# Patient Record
Sex: Female | Born: 1955 | Race: White | Hispanic: No | Marital: Married | Smoking: Former smoker
Health system: Southern US, Community
[De-identification: ages and names within clinical notes are randomized; demographics above are authoritative.]

## PROBLEM LIST (undated history)

## (undated) DIAGNOSIS — R112 Nausea with vomiting, unspecified: Secondary | ICD-10-CM

## (undated) DIAGNOSIS — K589 Irritable bowel syndrome without diarrhea: Secondary | ICD-10-CM

## (undated) DIAGNOSIS — Z9889 Other specified postprocedural states: Secondary | ICD-10-CM

## (undated) HISTORY — PX: TUBAL LIGATION: SHX77

## (undated) HISTORY — DX: Irritable bowel syndrome, unspecified: K58.9

## (undated) HISTORY — PX: CARPAL TUNNEL RELEASE: SHX101

## (undated) HISTORY — PX: APPENDECTOMY: SHX54

---

## 2001-05-08 ENCOUNTER — Ambulatory Visit (HOSPITAL_BASED_OUTPATIENT_CLINIC_OR_DEPARTMENT_OTHER): Admission: RE | Admit: 2001-05-08 | Discharge: 2001-05-08 | Payer: Self-pay | Admitting: Orthopedic Surgery

## 2010-02-13 ENCOUNTER — Ambulatory Visit: Payer: Self-pay | Admitting: Orthopedic Surgery

## 2010-02-13 DIAGNOSIS — M25569 Pain in unspecified knee: Secondary | ICD-10-CM | POA: Insufficient documentation

## 2010-12-26 NOTE — Assessment & Plan Note (Signed)
Summary: BI KNEE PAIN NEEDS XR/[facility]/BSF   Visit Type:  new patient  CC:  bilateral knee pain.Marland Kitchen  History of Present Illness: 55-year-old female presents with bilateral knee pain RIGHT greater than LEFT.  She's had pain in the back of her RIGHT knee for 6 weeks.  Her pain is medial it is severe it is 8/10 on a scale.  Her pain is noticed when she is sitting in summary by ibuprofen.  She does not note any swelling.  When she gets up from a seated position her pain increases.  Sitting for long periods with the knee flexed causes knee pain.  She also had some RIGHT leg pain behind the knee which seems to be resolving this appeared to be radiating from the hip.  Xrays today.  Allergies (verified): No Known Drug Allergies  Past History:  Past Medical History: none  Past Surgical History: none  Review of Systems Constitutional:  Denies weight loss, weight gain, fever, chills, and fatigue. Cardiovascular:  Denies chest pain, palpitations, fainting, and murmurs. Respiratory:  Denies short of breath, wheezing, couch, tightness, pain on inspiration, and snoring . Gastrointestinal:  Denies heartburn, nausea, vomiting, diarrhea, constipation, and blood in your stools. Genitourinary:  Denies frequency, urgency, difficulty urinating, painful urination, flank pain, and bleeding in urine. Neurologic:  Denies numbness, tingling, unsteady gait, dizziness, tremors, and seizure. Musculoskeletal:  Denies joint pain, swelling, instability, stiffness, redness, heat, and muscle pain. Endocrine:  Denies excessive thirst, exessive urination, and heat or cold intolerance. Psychiatric:  Denies nervousness, depression, anxiety, and hallucinations. Skin:  Denies changes in the skin, poor healing, rash, itching, and redness. HEENT:  Denies blurred or double vision, eye pain, redness, and watering. Immunology:  Denies seasonal allergies, sinus problems, and allergic to bee stings. Hemoatologic:  Denies easy bleeding  and brusing.  Physical Exam  Additional Exam:  GEN: well developed, well nourished, normal grooming and hygiene, no deformity and Body habitus medium  CDV: pulses are normal, no edema, no erythema. no tenderness  Lymph: normal lymph nodes   Skin: no rashes, skin lesions or open sores   NEURO: normal coordination, reflexes, sensation.   Psyche: awake, alert and oriented. Mood normal   Gait: normal  Bilateral knee examination Inspection reveals no joint swelling.  No tenderness in the joint lines.  Patellar tendon quadriceps tendon nontender Sheehan full range of motion in each knee Motor exam grade 5 strength both legs negative meniscal signs in both knees Both knees are stable     Impression & Recommendations:  Problem # 1:  PATELLO-FEMORAL SYNDROME (ICD-719.46) Assessment New  anterior knee pain syndrome with resolving sciatica.  Recommend home exercise program for 6 weeks.  X-rays were done in the office both knees 3 views Joint space narrowing none, bony abnormalities none, soft tissue swelling none  Impression normal RIGHT and LEFT knee  Orders: New Patient Level III (24401) Knee x-ray,  3 views (02725)  Patient Instructions: 1)  PROBLEM # 1 ANTERIOR KNEE PAIN  2)  PROBLEM  # 2 PROBABLE SCIATICA  3)  SELF DIRECTED EXERCISE PROGRAM

## 2010-12-26 NOTE — Letter (Signed)
Summary: History form  History form   Imported By: Jacklynn Ganong 02/24/2010 08:24:22  _____________________________________________________________________  External Attachment:    Type:   Image     Comment:   External Document

## 2011-04-13 NOTE — Op Note (Signed)
[place]  Patient:    Mariah Mack, Mariah Mack                     MRN: [id number] Proc. Date: 05/08/01 Attending:  Nicki Reaper, M.D.                           Operative Report  PREOPERATIVE DIAGNOSIS: Carpal tunnel syndrome, right hand.  POSTOPERATIVE DIAGNOSIS: Carpal tunnel syndrome, right hand.  OPERATION/PROCEDURE: Decompression of right median nerve.  SURGEON: Nicki Reaper, M.D.  ANESTHESIA: Forearm-based IV regional.  ANESTHESIOLOGIST: Janetta Hora. Gelene Mink, M.D.  INDICATIONS FOR PROCEDURE: The patient is a 55 year old female, with a history of carpal tunnel syndrome, EMG nerve conductions positive, which has not responded to conservative treatment.  DESCRIPTION OF PROCEDURE: The patient was brought to the operating room, where a forearm-based IV regional anesthetic was carried out without difficulty. She was prepped and draped using Betadine scrubbing solution with the right arm free.  A longitudinal incision was made in the palm and carried down through subcutaneous tissue.  Bleeders were electrocauterized.  Palmar fascia was split and the superficial palmar arch identified.  The flexor tendon to the ring and little finger was identified and through the ulnar side a median nerve carpal retinaculum was incised with sharp dissection.  A right angle and Sewall retractor were placed between skin and forearm fascia and the fascia was released for approximately 3 cm proximal to the wrist crease under direct vision.  The canal was explored and no further lesions were identified.  The wound was irrigated.  The skin was closed with interrupted 5-0 nylon sutures. A sterile compressive dressing and splint were applied.  The patient tolerated the procedure well and was taken to the recovery room for observation in satisfactory condition.  She was discharged home to return to the [facility] in one week, on Vicodin and Keflex. DD:   05/08/01 TD:  05/08/01 Job: [id number]

## 2011-07-04 ENCOUNTER — Telehealth: Payer: Self-pay | Admitting: General Practice

## 2011-07-04 NOTE — Telephone Encounter (Signed)
Gastroenterology Pre-Procedure Form  Request Date: 07/04/2011,  Requesting Physician: DR Ouida Sills     PATIENT INFORMATION:  Mariah Mack is a 55 y.o., female (DOB=1956/06/02).  PROCEDURE: Procedure(s) requested: colonoscopy Procedure Reason: screening for colon cancer  PATIENT REVIEW QUESTIONS: The patient reports the following:   1. Diabetes Melitis: no 2. Joint replacements in the past 12 months: no 3. Major health problems in the past 3 months: PT HAS CONSTIPATION , SHE WILL NEED A OFFICE VISIT FIRST. 4. Has an artificial valve or MVP:no 5. Has been advised in past to take antibiotics in advance of a procedure like teeth cleaning: no}    MEDICATIONS & ALLERGIES:    Patient reports the following regarding taking any blood thinners:   Plavix? no Aspirin?no Coumadin?  no  Patient confirms/reports the following medications:  No current outpatient prescriptions on file.    Patient confirms/reports the following allergies:  No Known Allergies  Patient is appropriate to schedule for requested procedure(s): yes   This Gastroenterology Pre-Precedure Form is being routed to the following provider(s) for review: R. Roetta Sessions, MD

## 2011-07-11 ENCOUNTER — Ambulatory Visit (INDEPENDENT_AMBULATORY_CARE_PROVIDER_SITE_OTHER): Payer: 59 | Admitting: Urgent Care

## 2011-07-11 ENCOUNTER — Encounter: Payer: Self-pay | Admitting: Urgent Care

## 2011-07-11 DIAGNOSIS — Z1211 Encounter for screening for malignant neoplasm of colon: Secondary | ICD-10-CM | POA: Insufficient documentation

## 2011-07-11 DIAGNOSIS — K589 Irritable bowel syndrome without diarrhea: Secondary | ICD-10-CM | POA: Insufficient documentation

## 2011-07-11 DIAGNOSIS — Z1210 Encounter for screening for malignant neoplasm of intestinal tract, unspecified: Secondary | ICD-10-CM

## 2011-07-11 NOTE — Patient Instructions (Signed)
Fiber supplement of choice (Fiberchoice or metamucil) Can use miralax 17 grams daily as needed for constipation  Irritable Bowel Syndrome You have a problem with your large bowel. This is called irritable bowel syndrome or spastic colon. It can cause cramping in the lower belly. You may also have more rumbling, gurgling, and bloating. Diarrhea and mucus in the stool are common. Constipation with hard small stools is also common. The pain will often recede after a bowel movement. This problem can come and go for months or years. The cause is unknown. A poor diet and emotional stress can add to the problems. There is no test to prove you have irritable bowel. Some tests and x-rays may be needed to be sure you do not have a more serious problem. Treatment of irritable bowel includes:  Diet - Increase fiber and bulk (whole grains, bran, vegetables, fruit) and reduce fats (fried foods, dairy products, meats).   Exercise regularly. Use walking or other exercises to reduce your stress.   Try a psyllium product (Metamucil, Fiberall). One tablespoon in water two times daily may be helpful.   For diarrhea from irritable bowel, avoid food containing fructose and lactose.   Reduce gas and bloating by avoiding milk products, beans, onions, carrots, prunes, apricots, bananas, raisins, pretzels, and bagels.   See your caregiver for follow-up as recommended.  SEEK IMMEDIATE MEDICAL CARE IF YOU HAVE:  Increasing abdominal pain.   Lasting diarrhea.   Severe constipation.   Blood in your stools.   Problems passing your water.   A fever.  Medications to treat spasms, diarrhea, anxiety, or depression may also be beneficial. Your caregiver can help you with this. MAKE SURE YOU:   Understand these instructions.   Will watch your condition.   Will get help right away if you are not doing well or get worse.  Document Released: 11/12/2005 Document Re-Released: 10/25/2008 [facility] Patient Information  2011 [place], [place].

## 2011-07-11 NOTE — Progress Notes (Signed)
Referring Provider:  Dr. Ouida Sills Primary Care Physician:  Carylon Perches, MD Primary Gastroenterologist:  Dr. Jena Gauss  Chief Complaint  Patient presents with  . Colonoscopy  . Constipation    HPI:  Mariah Mack is a 55 y.o. female here as a referral from Dr. Ouida Sills for colonoscopy.  Hx IBS that alternates w/ constipation & diarrhea life-long.  Taking metamucil & drinking prune juice.  Denies rectal bleeding or melena..  Denies abdominal pain except LUQ pain if she eats corn products.  Rare heartburn or indigestion couple times per mo, responds to TUMs.  Denies dysphagia or odynophagia.  Gained 15# over past 4 yrs, trying to lose wt on wt watchers.    Past Medical History  Diagnosis Date  . IBS (irritable bowel syndrome)    Past Surgical History  Procedure Date  . Appendectomy    No current outpatient prescriptions on file.   Allergies as of 07/11/2011  . (No Known Allergies)   Family History  Problem Relation Age of Onset  . Stroke Father   . Coronary artery disease Mother    History   Social History  . Marital Status: Married    Spouse Name: N/A    Number of Children: 3  . 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 Smoking status: Former Smoker -- 0.5 packs/day for 8 years    Types: Cigarettes  . Smokeless tobacco: Not on file   Comment: quit 27 years ago  . Alcohol Use: No  . Drug Use: No  . Sexually Active: Yes    Birth Control/ Protection: Post-menopausal   Other Topics Concern  . Not on file   Social History Narrative  . No narrative on file    Review of Systems: Gen: Denies any fever, chills, sweats, anorexia, fatigue, weakness, malaise, weight loss, and sleep disorder CV: Denies chest pain, angina, palpitations, syncope, orthopnea, PND, peripheral edema, and claudication. Resp: Denies dyspnea at rest, dyspnea with exercise, cough, sputum, wheezing, coughing up blood, and pleurisy. GI: Denies vomiting  blood, jaundice, and fecal incontinence.    GU : Denies urinary burning, blood in urine, urinary frequency, urinary hesitancy, nocturnal urination, and urinary incontinence. MS: Denies joint pain, limitation of movement, and swelling, stiffness, low back pain, extremity pain. Denies muscle weakness, cramps, atrophy.  Derm: Denies rash, itching, dry skin, hives, moles, warts, or unhealing ulcers.  Psych: Denies depression, anxiety, memory loss, suicidal ideation, hallucinations, paranoia, and confusion. Heme: Denies bruising, bleeding, and enlarged lymph nodes.  Physical Exam: BP 112/73  Pulse 63  Temp(Src) 97.8 F (36.6 C) (Temporal)  Ht 5\' 6"  (1.676 m)  Wt 176 lb 9.6 oz (80.105 kg)  BMI 28.50 kg/m2 General:   Alert,  Well-developed, well-nourished, pleasant and cooperative in NAD Head:  Normocephalic and atraumatic. Eyes:  Sclera clear, no icterus.   Conjunctiva pink. Ears:  Normal auditory acuity. Nose:  No deformity, discharge,  or lesions. Mouth:  No deformity or lesions, dentition normal. Neck:  Supple; no masses or thyromegaly. Lungs:  Clear throughout to auscultation.   No wheezes, crackles, or rhonchi. No acute distress. Heart:  Regular rate and rhythm; no murmurs, clicks, rubs,  or gallops. Abdomen:  Soft, nontender and nondistended. No masses, hepatosplenomegaly or hernias noted. Normal bowel sounds, without guarding, and without rebound.   Rectal:  Deferred until time of colonoscopy.   Msk:  Symmetrical without gross deformities. Normal posture. Pulses:  Normal pulses noted. Extremities:  Without clubbing or edema. Neurologic:  Alert and  oriented x4;  grossly normal neurologically. Skin:  Intact without significant lesions or rashes. Cervical Nodes:  No significant cervical adenopathy. Psych:  Alert and cooperative. Normal mood and affect.

## 2011-07-11 NOTE — Assessment & Plan Note (Addendum)
Due for screening colonoscopy.  No alarm features.  IBS-mixed for yrs.  I have discussed risks & benefits which include, but are not limited to, bleeding, infection, perforation & drug reaction.  The patient agrees with this plan & written consent will be obtained.

## 2011-07-11 NOTE — Assessment & Plan Note (Addendum)
Mariah Mack is a 55 y.o. 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   Fiber supplement of choice (Fiberchoice or metamucil) Can use miralax 17 grams daily as needed for constipation IBS literature

## 2011-07-16 MED ORDER — SODIUM CHLORIDE 0.45 % IV SOLN
Freq: Once | INTRAVENOUS | Status: AC
  Administered 2011-07-17: 20 mL via INTRAVENOUS

## 2011-07-17 ENCOUNTER — Ambulatory Visit (HOSPITAL_COMMUNITY)
Admission: RE | Admit: 2011-07-17 | Discharge: 2011-07-17 | Disposition: A | Discharge status: Home / Self Care | Payer: 59 | Source: Ambulatory Visit | Attending: Internal Medicine | Admitting: Internal Medicine

## 2011-07-17 ENCOUNTER — Encounter (HOSPITAL_COMMUNITY)
Admission: RE | Disposition: A | Discharge status: Home / Self Care | Payer: Self-pay | Source: Ambulatory Visit | Attending: Internal Medicine

## 2011-07-17 ENCOUNTER — Encounter (HOSPITAL_COMMUNITY): Payer: Self-pay

## 2011-07-17 DIAGNOSIS — Z1211 Encounter for screening for malignant neoplasm of colon: Secondary | ICD-10-CM

## 2011-07-17 HISTORY — DX: Other specified postprocedural states: Z98.890

## 2011-07-17 HISTORY — DX: Nausea with vomiting, unspecified: R11.2

## 2011-07-17 HISTORY — PX: COLONOSCOPY: SHX5424

## 2011-07-17 SURGERY — COLONOSCOPY
Anesthesia: Moderate Sedation

## 2011-07-17 MED ORDER — MIDAZOLAM HCL 5 MG/5ML IJ SOLN
INTRAMUSCULAR | Status: DC | PRN
  Administered 2011-07-17: 1 mg via INTRAVENOUS
  Administered 2011-07-17 (×2): 2 mg via INTRAVENOUS

## 2011-07-17 MED ORDER — ONDANSETRON HCL 4 MG/2ML IJ SOLN
INTRAMUSCULAR | Status: DC | PRN
  Administered 2011-07-17: 4 mg via INTRAVENOUS

## 2011-07-17 MED ORDER — MIDAZOLAM HCL 5 MG/5ML IJ SOLN
INTRAMUSCULAR | Status: AC
  Filled 2011-07-17: qty 10

## 2011-07-17 MED ORDER — ONDANSETRON HCL 4 MG/2ML IJ SOLN
INTRAMUSCULAR | Status: AC
  Filled 2011-07-17: qty 2

## 2011-07-17 MED ORDER — MEPERIDINE HCL 100 MG/ML IJ SOLN
INTRAMUSCULAR | Status: AC
  Filled 2011-07-17: qty 2

## 2011-07-17 MED ORDER — MEPERIDINE HCL 100 MG/ML IJ SOLN
INTRAMUSCULAR | Status: DC | PRN
  Administered 2011-07-17: 25 mg via INTRAVENOUS
  Administered 2011-07-17: 50 mg via INTRAVENOUS

## 2011-07-17 NOTE — H&P (Signed)
  Primary Care Physician:  Carylon Perches, MD Primary Gastroenterologist:  Dr.   Pre-Procedure History & Physical: HPI:  Mariah Mack is a 55 y.o. female here for   Past Medical History  Diagnosis Date  . IBS (irritable bowel syndrome)   . PONV (postoperative nausea and vomiting)     Past Surgical History  Procedure Date  . Appendectomy   . Tubal ligation     Prior to Admission medications   Not on File    Allergies as of 07/11/2011  . (No Known Allergies)    Family History  Problem Relation Age of Onset  . Stroke Father   . Coronary artery disease Mother   . Anesthesia problems Neg Hx   . Hypotension Neg Hx   . Malignant hyperthermia Neg Hx   . Pseudochol deficiency Neg Hx     History   Social History  . Marital Status: Married    Spouse Name: N/A    Number of Children: 3  . 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 Smoking status: Former Smoker -- 0.5 packs/day for 8 years    Types: Cigarettes    Quit date: 07/16/1985  . Smokeless tobacco: Not on file   Comment: quit 27 years ago  . Alcohol Use: No  . Drug Use: No  . Sexually Active: Yes    Birth Control/ Protection: Post-menopausal   Other Topics Concern  . Not on file   Social History Narrative  . No narrative on file    Review of Systems: See HPI, otherwise negative ROS  Physical Exam: BP 127/78  Pulse 63  Temp(Src) 98.1 F (36.7 C) (Oral)  Resp 17  Ht 5\' 6"  (1.676 m)  Wt 170 lb 8 oz (77.338 kg)  BMI 27.52 kg/m2  SpO2 96% General:   Alert,  Well-developed, well-nourished, pleasant and cooperative in NAD Head:  Normocephalic and atraumatic. Eyes:  Sclera clear, no icterus.   Conjunctiva pink. Ears:  Normal auditory acuity. Nose:  No deformity, discharge,  or lesions. Mouth:  No deformity or lesions, dentition normal. Neck:  Supple; no masses or thyromegaly. Lungs:  Clear throughout to auscultation.   No wheezes, crackles, or  rhonchi. No acute distress. Heart:  Regular rate and rhythm; no murmurs, clicks, rubs,  or gallops. Abdomen:  Soft, nontender and nondistended. No masses, hepatosplenomegaly or hernias noted. Normal bowel sounds, without guarding, and without rebound.   Msk:  Symmetrical without gross deformities. Normal posture. Pulses:  Normal pulses noted. Extremities:  Without clubbing or edema. Neurologic:  Alert and  oriented x4;  grossly normal neurologically. Skin:  Intact without significant lesions or rashes. Cervical Nodes:  No significant cervical adenopathy. Psych:  Alert and cooperative. Normal mood and affect.  Impression/Plan:

## 2011-07-17 NOTE — H&P (Signed)
  Primary Care Physician:  Carylon Perches, MD Primary Gastroenterologist:  Dr. Jena Gauss  Pre-Procedure History & Physical: HPI:  Mariah Mack is a 55 y.o. female is here for a screening colonoscopy. First examination. No lower GI tract symptoms. No family history of colon polyps or colon cancer.  Past Medical History  Diagnosis Date  . IBS (irritable bowel syndrome)   . PONV (postoperative nausea and vomiting)     Past Surgical History  Procedure Date  . Appendectomy   . Tubal ligation     Prior to Admission medications   Not on File    Allergies as of 07/11/2011  . (No Known Allergies)    Family History  Problem Relation Age of Onset  . Stroke Father   . Coronary artery disease Mother   . Anesthesia problems Neg Hx   . Hypotension Neg Hx   . Malignant hyperthermia Neg Hx   . Pseudochol deficiency Neg Hx     History   Social History  . Marital Status: Married    Spouse Name: N/A    Number of Children: 3  . 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 Smoking status: Former Smoker -- 0.5 packs/day for 8 years    Types: Cigarettes    Quit date: 07/16/1985  . Smokeless tobacco: Not on file   Comment: quit 27 years ago  . Alcohol Use: No  . Drug Use: No  . Sexually Active: Yes    Birth Control/ Protection: Post-menopausal   Other Topics Concern  . Not on file   Social History Narrative  . No narrative on file    Review of Systems: See HPI, otherwise negative ROS  Physical Exam: BP 127/78  Pulse 63  Temp(Src) 98.1 F (36.7 C) (Oral)  Resp 17  Ht 5\' 6"  (1.676 m)  Wt 170 lb 8 oz (77.338 kg)  BMI 27.52 kg/m2  SpO2 96% General:   Alert,  Well-developed, well-nourished, pleasant and cooperative in NAD Head:  Normocephalic and atraumatic. Eyes:  Sclera clear, no icterus.   Conjunctiva pink. Ears:  Normal auditory acuity. Nose:  No deformity, discharge,  or lesions. Mouth:  No deformity or lesions,  dentition normal. Neck:  Supple; no masses or thyromegaly. Lungs:  Clear throughout to auscultation.   No wheezes, crackles, or rhonchi. No acute distress. Heart:  Regular rate and rhythm; no murmurs, clicks, rubs,  or gallops. Abdomen:  Soft, nontender and nondistended. No masses, hepatosplenomegaly or hernias noted. Normal bowel sounds, without guarding, and without rebound.   Msk:  Symmetrical without gross deformities. Normal posture. Pulses:  Normal pulses noted. Extremities:  Without clubbing or edema. Neurologic:  Alert and  oriented x4;  grossly normal neurologically. Skin:  Intact without significant lesions or rashes. Cervical Nodes:  No significant cervical adenopathy. Psych:  Alert and cooperative. Normal mood and affect.  Impression/Plan: Mariah Mack is now here to undergo a screening colonoscopy.  Average risk examination.  Risks, benefits, limitations, imponderables and alternatives regarding colonoscopy have been reviewed with the patient. Questions have been answered. All parties agreeable.

## 2011-07-20 ENCOUNTER — Encounter (HOSPITAL_COMMUNITY): Payer: Self-pay | Admitting: Internal Medicine

## 2012-02-05 ENCOUNTER — Ambulatory Visit (HOSPITAL_COMMUNITY): Payer: 59 | Admitting: Physical Therapy

## 2014-06-17 ENCOUNTER — Telehealth: Payer: Self-pay | Admitting: *Deleted

## 2014-06-17 NOTE — Telephone Encounter (Signed)
error 

## 2016-06-25 ENCOUNTER — Other Ambulatory Visit (HOSPITAL_COMMUNITY): Payer: Self-pay | Admitting: Unknown Physician Specialty

## 2016-06-25 DIAGNOSIS — Z1231 Encounter for screening mammogram for malignant neoplasm of breast: Secondary | ICD-10-CM

## 2016-06-29 ENCOUNTER — Ambulatory Visit (HOSPITAL_COMMUNITY)
Admission: RE | Admit: 2016-06-29 | Discharge: 2016-06-29 | Disposition: A | Discharge status: Home / Self Care | Payer: BLUE CROSS/BLUE SHIELD | Source: Ambulatory Visit | Attending: Unknown Physician Specialty | Admitting: Unknown Physician Specialty

## 2016-06-29 DIAGNOSIS — Z1231 Encounter for screening mammogram for malignant neoplasm of breast: Secondary | ICD-10-CM | POA: Insufficient documentation

## 2017-06-11 ENCOUNTER — Other Ambulatory Visit (HOSPITAL_COMMUNITY): Payer: Self-pay | Admitting: Unknown Physician Specialty

## 2017-06-11 DIAGNOSIS — Z1231 Encounter for screening mammogram for malignant neoplasm of breast: Secondary | ICD-10-CM

## 2017-07-01 ENCOUNTER — Ambulatory Visit (HOSPITAL_COMMUNITY)
Admission: RE | Admit: 2017-07-01 | Discharge: 2017-07-01 | Disposition: A | Discharge status: Home / Self Care | Payer: Commercial Managed Care - PPO | Source: Ambulatory Visit | Attending: Unknown Physician Specialty | Admitting: Unknown Physician Specialty

## 2017-07-01 DIAGNOSIS — Z1231 Encounter for screening mammogram for malignant neoplasm of breast: Secondary | ICD-10-CM | POA: Diagnosis present

## 2017-07-25 ENCOUNTER — Ambulatory Visit: Payer: Self-pay | Admitting: Orthopedic Surgery

## 2017-08-01 ENCOUNTER — Encounter (INDEPENDENT_AMBULATORY_CARE_PROVIDER_SITE_OTHER): Payer: Self-pay | Admitting: *Deleted

## 2017-08-01 NOTE — Telephone Encounter (Signed)
This encounter was created in error - please disregard.

## 2018-06-02 ENCOUNTER — Other Ambulatory Visit (HOSPITAL_COMMUNITY): Payer: Self-pay | Admitting: Unknown Physician Specialty

## 2018-06-02 DIAGNOSIS — Z1231 Encounter for screening mammogram for malignant neoplasm of breast: Secondary | ICD-10-CM

## 2018-07-09 ENCOUNTER — Ambulatory Visit (HOSPITAL_COMMUNITY): Payer: Commercial Managed Care - PPO

## 2018-08-08 ENCOUNTER — Ambulatory Visit (HOSPITAL_COMMUNITY)
Admission: RE | Admit: 2018-08-08 | Discharge: 2018-08-08 | Disposition: A | Discharge status: Home / Self Care | Payer: Commercial Managed Care - PPO | Source: Ambulatory Visit | Attending: Unknown Physician Specialty | Admitting: Unknown Physician Specialty

## 2018-08-08 DIAGNOSIS — Z1231 Encounter for screening mammogram for malignant neoplasm of breast: Secondary | ICD-10-CM | POA: Diagnosis not present

## 2018-09-18 ENCOUNTER — Ambulatory Visit (INDEPENDENT_AMBULATORY_CARE_PROVIDER_SITE_OTHER): Payer: Commercial Managed Care - PPO | Admitting: Otolaryngology

## 2018-09-18 DIAGNOSIS — R490 Dysphonia: Secondary | ICD-10-CM

## 2018-09-18 DIAGNOSIS — K219 Gastro-esophageal reflux disease without esophagitis: Secondary | ICD-10-CM

## 2018-10-16 ENCOUNTER — Ambulatory Visit (INDEPENDENT_AMBULATORY_CARE_PROVIDER_SITE_OTHER): Payer: Commercial Managed Care - PPO | Admitting: Otolaryngology

## 2018-10-16 DIAGNOSIS — R490 Dysphonia: Secondary | ICD-10-CM

## 2018-10-16 DIAGNOSIS — K219 Gastro-esophageal reflux disease without esophagitis: Secondary | ICD-10-CM | POA: Diagnosis not present

## 2018-10-16 DIAGNOSIS — R070 Pain in throat: Secondary | ICD-10-CM | POA: Diagnosis not present

## 2019-10-14 ENCOUNTER — Other Ambulatory Visit (HOSPITAL_COMMUNITY): Payer: Self-pay | Admitting: Unknown Physician Specialty

## 2019-10-14 DIAGNOSIS — Z1231 Encounter for screening mammogram for malignant neoplasm of breast: Secondary | ICD-10-CM

## 2019-10-21 ENCOUNTER — Ambulatory Visit (HOSPITAL_COMMUNITY)
Admission: RE | Admit: 2019-10-21 | Discharge: 2019-10-21 | Disposition: A | Discharge status: Home / Self Care | Payer: Commercial Managed Care - PPO | Source: Ambulatory Visit | Attending: Unknown Physician Specialty | Admitting: Unknown Physician Specialty

## 2019-10-21 ENCOUNTER — Other Ambulatory Visit: Payer: Self-pay

## 2019-10-21 DIAGNOSIS — Z1231 Encounter for screening mammogram for malignant neoplasm of breast: Secondary | ICD-10-CM | POA: Insufficient documentation

## 2019-12-11 ENCOUNTER — Other Ambulatory Visit: Payer: Self-pay

## 2019-12-11 ENCOUNTER — Ambulatory Visit: Payer: Commercial Managed Care - PPO | Attending: Internal Medicine

## 2019-12-11 DIAGNOSIS — Z20822 Contact with and (suspected) exposure to covid-19: Secondary | ICD-10-CM

## 2019-12-12 LAB — NOVEL CORONAVIRUS, NAA: SARS-CoV-2, NAA: NOT DETECTED

## 2020-09-02 ENCOUNTER — Encounter: Payer: Self-pay | Admitting: *Deleted

## 2020-09-05 ENCOUNTER — Encounter: Payer: Self-pay | Admitting: Cardiology

## 2020-09-05 ENCOUNTER — Encounter: Payer: Self-pay | Admitting: *Deleted

## 2020-09-05 ENCOUNTER — Ambulatory Visit (INDEPENDENT_AMBULATORY_CARE_PROVIDER_SITE_OTHER): Payer: Commercial Managed Care - PPO | Admitting: Cardiology

## 2020-09-05 VITALS — BP 120/74 | HR 69 | Ht 66.0 in | Wt 188.4 lb

## 2020-09-05 DIAGNOSIS — R072 Precordial pain: Secondary | ICD-10-CM | POA: Diagnosis not present

## 2020-09-05 DIAGNOSIS — Z9189 Other specified personal risk factors, not elsewhere classified: Secondary | ICD-10-CM | POA: Diagnosis not present

## 2020-09-05 DIAGNOSIS — R9431 Abnormal electrocardiogram [ECG] [EKG]: Secondary | ICD-10-CM

## 2020-09-05 NOTE — Patient Instructions (Addendum)
Medication Instructions:   Your physician recommends that you continue on your current medications as directed. Please refer to the Current Medication list given to you today.  Labwork:  Covid testing 2-3 days before your stress test. Please quarantine after your covid test until your stress test is completed.   Testing/Procedures: Your physician has requested that you have en exercise stress myoview. For further information please visit [email or web address] Please follow instruction sheet, as given.  Follow-Up:  Your physician recommends that you schedule a follow-up appointment in: pending  Any Other Special Instructions Will Be Listed Below (If Applicable).  If you need a refill on your cardiac medications before your next appointment, please call your pharmacy.

## 2020-09-05 NOTE — Progress Notes (Signed)
Cardiology Office Note  Date: 09/05/2020   ID: Mariah Mack, DOB September 08, 1956, MRN [id number]  PCP:  Carylon Perches, MD  Cardiologist:  Nona Dell, MD Electrophysiologist:  None   Chief Complaint  Patient presents with  . Chest discomfort    History of Present Illness: Mariah Mack is a 64 y.o. female referred for cardiology consultation by Dr. Ouida Sills for the evaluation of chest discomfort.  She states that she has had 2 or 3 episodes of a sensation of heaviness in her chest associated with forceful heartbeat, has occurred sporadically at rest without known precipitant, lasts about 10 minutes.  No associated syncope.  No specific exertional symptoms.  She does tell me that her husband mentions she has been snoring a lot more over the last year.  She states that her mother was diagnosed with heart disease and underwent CABG at age 24.  No other history of premature CAD in the family.  She does not report any personal history of hypertension, type 2 diabetes mellitus, or hyperlipidemia.  She has a remote history of tobacco use.  I personally reviewed her ECG from Dr. Alonza Smoker office, sinus rhythm noted with poor R wave progression, no prior tracing for comparison.  She has not undergone any prior ischemic testing, would like to start a more regular walking plan.  Past Medical History:  Diagnosis Date  . IBS (irritable bowel syndrome)     Past Surgical History:  Procedure Laterality Date  . APPENDECTOMY    . COLONOSCOPY  07/17/2011   Procedure: COLONOSCOPY;  Surgeon: Corbin Ade, MD;  [facility];  Service: Endoscopy;  Laterality: N/A;  7:30AM  . TUBAL LIGATION      No current outpatient medications on file.   No current facility-administered medications for this visit.   Allergies:  Patient has no known allergies.   Social History: The patient  reports that she quit smoking about 35 years ago. Her smoking use included cigarettes. She has a 4.00 pack-year  smoking history. She has never used smokeless tobacco. She reports current alcohol use. She reports that she does not use drugs.   Family History: The patient's family history includes Coronary artery disease in her mother; Diabetes Mellitus II in her father; Hypertension in her father; Stroke in her father.   ROS: No syncope.  Physical Exam: VS:  BP 120/74 (BP Location: Right Arm)   Pulse 69   Ht 5\' 6"  (1.676 m)   Wt 188 lb 6.4 oz (85.5 kg)   SpO2 96%   BMI 30.41 kg/m , BMI Body mass index is 30.41 kg/m.  Wt Readings from Last 3 Encounters:  09/05/20 188 lb 6.4 oz (85.5 kg)  07/17/11 170 lb 8 oz (77.3 kg)  07/11/11 176 lb 9.6 oz (80.1 kg)    General: Patient appears comfortable at rest. HEENT: Conjunctiva and lids normal, wearing a mask. Neck: Supple, no elevated JVP or carotid bruits, no thyromegaly. Lungs: Clear to auscultation, nonlabored breathing at rest. Cardiac: Regular rate and rhythm, no S3 or significant systolic murmur, no pericardial rub. Abdomen: Soft, bowel sounds present, no guarding or rebound. Extremities: No pitting edema, radial pulses full. Skin: Warm and dry. Musculoskeletal: No kyphosis. Neuropsychiatric: Alert and oriented x3, affect grossly appropriate.  ECG:  An ECG dated 07/29/2020 was personally reviewed today and demonstrated:  Sinus rhythm with decreased R wave progression.  Recent Labwork:  No recent lab work for review today.  Other Studies Reviewed Today:  No prior cardiac  testing for review today.  Assessment and Plan:  1.  Recurrent, atypical chest discomfort in a 64 year old woman with no known major cardiac risk factors, although her mother was diagnosed with heart disease and underwent CABG at age 31 (she lived to be 100).  Baseline ECG shows poor R wave progression, not a definite anterior infarct pattern however.  Blood pressure is normal today.  She has a remote history of tobacco use.  She would like to start a regular walking plan.   We will proceed with a exercise Myoview for ischemic assessment.  2.  Regular snoring over the last year mentioned to patient by her husband.  Would suggest formal sleep study for evaluation of OSA per Dr. Ouida Sills.  She states that she sometimes does get tired in the afternoons.  Medication Adjustments/Labs and Tests Ordered: Current medicines are reviewed at length with the patient today.  Concerns regarding medicines are outlined above.   Tests Ordered: Orders Placed This Encounter  Procedures  . NM Myocar Multi W/Spect W/Wall Motion / EF    Medication Changes: No orders of the defined types were placed in this encounter.   Disposition:  Follow up test results.  Signed, Jonelle Sidle, MD, [facility] 09/05/2020 11:11 AM    [facility] at [facility] [address] [place], [place], [place] [address] Phone: [phone]; Fax: [phone]

## 2020-09-16 ENCOUNTER — Other Ambulatory Visit (HOSPITAL_COMMUNITY)
Admission: RE | Admit: 2020-09-16 | Discharge: 2020-09-16 | Disposition: A | Discharge status: Home / Self Care | Payer: Commercial Managed Care - PPO | Source: Ambulatory Visit | Attending: Cardiology | Admitting: Cardiology

## 2020-09-16 ENCOUNTER — Other Ambulatory Visit: Payer: Self-pay

## 2020-09-16 DIAGNOSIS — Z20822 Contact with and (suspected) exposure to covid-19: Secondary | ICD-10-CM | POA: Diagnosis not present

## 2020-09-16 DIAGNOSIS — Z01812 Encounter for preprocedural laboratory examination: Secondary | ICD-10-CM | POA: Diagnosis present

## 2020-09-16 LAB — SARS CORONAVIRUS 2 (TAT 6-24 HRS): SARS Coronavirus 2: NEGATIVE

## 2020-09-19 ENCOUNTER — Encounter (HOSPITAL_COMMUNITY): Payer: Self-pay

## 2020-09-19 ENCOUNTER — Other Ambulatory Visit: Payer: Self-pay

## 2020-09-19 ENCOUNTER — Encounter (HOSPITAL_COMMUNITY)
Admission: RE | Admit: 2020-09-19 | Discharge: 2020-09-19 | Disposition: A | Discharge status: Home / Self Care | Payer: Commercial Managed Care - PPO | Source: Ambulatory Visit | Attending: Cardiology | Admitting: Cardiology

## 2020-09-19 ENCOUNTER — Ambulatory Visit (HOSPITAL_COMMUNITY)
Admission: RE | Admit: 2020-09-19 | Discharge: 2020-09-19 | Disposition: A | Discharge status: Home / Self Care | Payer: Commercial Managed Care - PPO | Source: Ambulatory Visit | Attending: Internal Medicine | Admitting: Internal Medicine

## 2020-09-19 ENCOUNTER — Telehealth: Payer: Self-pay | Admitting: *Deleted

## 2020-09-19 DIAGNOSIS — R072 Precordial pain: Secondary | ICD-10-CM | POA: Insufficient documentation

## 2020-09-19 DIAGNOSIS — R9431 Abnormal electrocardiogram [ECG] [EKG]: Secondary | ICD-10-CM | POA: Diagnosis not present

## 2020-09-19 LAB — NM MYOCAR MULTI W/SPECT W/WALL MOTION / EF
Estimated workload: 10.1 METS
Exercise duration (min): 8 min
Exercise duration (sec): 1 s
LV dias vol: 50 mL (ref 46–106)
LV sys vol: 16 mL
MPHR: 158 {beats}/min
Peak HR: 139 {beats}/min
Percent HR: 87 %
RATE: 0.46
RPE: 15
Rest HR: 60 {beats}/min
SDS: 0
SRS: 0
SSS: 0
TID: 0.95

## 2020-09-19 MED ORDER — TECHNETIUM TC 99M TETROFOSMIN IV KIT
10.0000 | PACK | Freq: Once | INTRAVENOUS | Status: AC | PRN
  Administered 2020-09-19: 10.2 via INTRAVENOUS

## 2020-09-19 MED ORDER — SODIUM CHLORIDE FLUSH 0.9 % IV SOLN
INTRAVENOUS | Status: AC
  Administered 2020-09-19: 10 mL via INTRAVENOUS
  Filled 2020-09-19: qty 10

## 2020-09-19 MED ORDER — REGADENOSON 0.4 MG/5ML IV SOLN
INTRAVENOUS | Status: AC
  Filled 2020-09-19: qty 5

## 2020-09-19 MED ORDER — TECHNETIUM TC 99M TETROFOSMIN IV KIT
30.0000 | PACK | Freq: Once | INTRAVENOUS | Status: AC | PRN
  Administered 2020-09-19: 32 via INTRAVENOUS

## 2020-09-19 NOTE — Telephone Encounter (Signed)
Pt aware at stress test appt today

## 2020-09-19 NOTE — Telephone Encounter (Signed)
-----   Message from Jonelle Sidle, MD sent at 09/19/2020  7:45 AM EDT ----- Results reviewed.  Negative SARS coronavirus 2 test prior to stress testing.

## 2020-09-20 ENCOUNTER — Telehealth: Payer: Self-pay | Admitting: *Deleted

## 2020-09-20 NOTE — Telephone Encounter (Signed)
Patient informed. Copy sent to PCP °

## 2020-09-20 NOTE — Telephone Encounter (Signed)
-----   Message from Jonelle Sidle, MD sent at 09/19/2020  4:33 PM EDT ----- Results reviewed.  Please let her know that the stress test was reassuring.  There were no perfusion defects to suggest ischemia and the Duke treadmill score was low risk.  This argues against significant obstructive CAD as cause of her symptoms.  Would keep follow-up with Dr. Ouida Sills.

## 2020-10-11 ENCOUNTER — Ambulatory Visit
Admission: EM | Admit: 2020-10-11 | Discharge: 2020-10-11 | Disposition: A | Discharge status: Home / Self Care | Payer: Commercial Managed Care - PPO | Attending: Emergency Medicine | Admitting: Emergency Medicine

## 2020-10-11 ENCOUNTER — Other Ambulatory Visit: Payer: Self-pay

## 2020-10-11 ENCOUNTER — Ambulatory Visit: Payer: Commercial Managed Care - PPO | Admitting: Cardiology

## 2020-10-11 ENCOUNTER — Encounter: Payer: Self-pay | Admitting: Emergency Medicine

## 2020-10-11 DIAGNOSIS — J029 Acute pharyngitis, unspecified: Secondary | ICD-10-CM | POA: Diagnosis not present

## 2020-10-11 LAB — POCT RAPID STREP A (OFFICE): Rapid Strep A Screen: NEGATIVE

## 2020-10-11 NOTE — ED Provider Notes (Signed)
[facility]   [id number] 10/11/20 Arrival Time: 1407  GU:RKYH THROAT  SUBJECTIVE: History from: patient and family.  Mariah Mack is a 64 y.o. female presented to the urgent care for complaint of sore throat for the past few 2-3 days.  Denies sick exposure to strep, flu or mono, or precipitating event.  Has tried OTC medication without relief.  Symptoms are made worse with swallowing, but tolerating liquids and own secretions without difficulty.  Denies previous symptoms in the past.   Denies fever, chills, fatigue, ear pain, sinus pain, rhinorrhea, nasal congestion, cough, SOB, wheezing, chest pain, nausea, rash, changes in bowel or bladder habits.       ROS: As per HPI.  All other pertinent ROS negative.       Past Medical History:  Diagnosis Date  . IBS (irritable bowel syndrome)    Past Surgical History:  Procedure Laterality Date  . APPENDECTOMY    . COLONOSCOPY  07/17/2011   Procedure: COLONOSCOPY;  Surgeon: Corbin Ade, MD;  [facility];  Service: Endoscopy;  Laterality: N/A;  7:30AM  . TUBAL LIGATION     No Known Allergies No current facility-administered medications on file prior to encounter.   No current outpatient medications on file prior to encounter.   Social History   Socioeconomic History  . Marital status: Married    Spouse name: Not on file  . Number of children: 3  . Years of education: Not on file  . Highest education level: Not on file  Occupational History  . Occupation: [employer] Supervisor  Tobacco Use  . Smoking status: Former Smoker    Packs/day: 0.50    Years: 8.00    Pack years: 4.00    Types: Cigarettes    Quit date: 07/16/1985    Years since quitting: 35.2  . Smokeless tobacco: Never Used  Substance and Sexual Activity  . Alcohol use: Yes    Comment: rare   . Drug use: No  . Sexual activity: Not on file  Other Topics Concern  . Not on file  Social History Narrative  . Not on file   Social Determinants  of Health   Financial Resource Strain:   . Difficulty of Paying Living Expenses: Not on file  Food Insecurity:   . Worried About Programme researcher, broadcasting/film/video in the Last Year: Not on file  . Ran Out of Food in the Last Year: Not on file  Transportation Needs:   . Lack of Transportation (Medical): Not on file  . Lack of Transportation (Non-Medical): Not on file  Physical Activity:   . Days of Exercise per Week: Not on file  . Minutes of Exercise per Session: Not on file  Stress:   . Feeling of Stress : Not on file  Social Connections:   . Frequency of Communication with Friends and Family: Not on file  . Frequency of Social Gatherings with Friends and Family: Not on file  . Attends Religious Services: Not on file  . Active Member of Clubs or Organizations: Not on file  . Attends Banker Meetings: Not on file  . Marital Status: Not on file  Intimate Partner Violence:   . Fear of Current or Ex-Partner: Not on file  . Emotionally Abused: Not on file  . Physically Abused: Not on file  . Sexually Abused: Not on file   Family History  Problem Relation Age of Onset  . Stroke Father   . Diabetes Mellitus II Father   .  Hypertension Father   . Coronary artery disease Mother   . Anesthesia problems Neg Hx   . Hypotension Neg Hx   . Malignant hyperthermia Neg Hx   . Pseudochol deficiency Neg Hx     OBJECTIVE:  Vitals:   10/11/20 1531 10/11/20 1532  BP: 115/75   Pulse: 70   Resp: 19   Temp: 98.2 F (36.8 C)   TempSrc: Oral   SpO2: 96%   Weight:  187 lb 6.3 oz (85 kg)  Height:  5\' 6"  (1.676 m)     General appearance: alert; appears fatigued, but nontoxic, speaking in full sentences and managing own secretions HEENT: NCAT; Ears: EACs clear, TMs pearly gray with visible cone of light, without erythema; Eyes: PERRL, EOMI grossly; Nose: no obvious rhinorrhea; Throat: oropharynx clear, tonsils 1+ and mildly erythematous without white tonsillar exudates, uvula midline Neck:  supple without LAD Lungs: CTA bilaterally without adventitious breath sounds; cough absent Heart: regular rate and rhythm.  Radial pulses 2+ symmetrical bilaterally Skin: warm and dry Psychological: alert and cooperative; normal mood and affect  LABS: Results for orders placed or performed during the hospital encounter of 10/11/20 (from the past 24 hour(s))  POCT rapid strep A     Status: None   Collection Time: 10/11/20  3:40 PM  Result Value Ref Range   Rapid Strep A Screen Negative Negative     ASSESSMENT & PLAN:  1. Sore throat     No orders of the defined types were placed in this encounter.   Discharge instructions   Strep test negative, will send out for culture and we will call you with results Get plenty of rest and push fluids Use throat lozenges such as Vicks, Cepacol or Hall's to help soothe your throat Drink warm or cool liquids, use throat lozenges, or popsicles to help alleviate symptoms Take OTC ibuprofen or tylenol as needed for pain Follow up with PCP if symptoms persists Return or go to ER if patient has any new or worsening symptoms such as fever, chills, nausea, vomiting, worsening sore throat, cough, abdominal pain, chest pain, changes in bowel or bladder habits, etc...  Reviewed expectations re: course of current medical issues. Questions answered. Outlined signs and symptoms indicating need for more acute intervention. Patient verbalized understanding. After Visit Summary given.         10/13/20, FNP 10/11/20 1611

## 2020-10-11 NOTE — ED Triage Notes (Signed)
Sore throat and white spot on back of throat.  Husband recently dx w strep throat.

## 2020-10-11 NOTE — Discharge Instructions (Addendum)
   Strep test negative, will send out for culture and we will call you with results Get plenty of rest and push fluids Use throat lozenges such as Vicks, Cepacol or Hall's to help soothe your throat Drink warm or cool liquids, use throat lozenges, or popsicles to help alleviate symptoms Take OTC ibuprofen or tylenol as needed for pain Follow up with PCP if symptoms persists Return or go to ER if patient has any new or worsening symptoms such as fever, chills, nausea, vomiting, worsening sore throat, cough, abdominal pain, chest pain, changes in bowel or bladder habits, etc..Marland Kitchen

## 2020-10-14 LAB — CULTURE, GROUP A STREP (THRC)

## 2020-10-31 ENCOUNTER — Other Ambulatory Visit (HOSPITAL_COMMUNITY): Payer: Self-pay | Admitting: Nurse Practitioner

## 2020-10-31 DIAGNOSIS — Z1231 Encounter for screening mammogram for malignant neoplasm of breast: Secondary | ICD-10-CM

## 2020-11-16 ENCOUNTER — Other Ambulatory Visit: Payer: Self-pay

## 2020-11-16 ENCOUNTER — Ambulatory Visit (HOSPITAL_COMMUNITY)
Admission: RE | Admit: 2020-11-16 | Discharge: 2020-11-16 | Disposition: A | Discharge status: Home / Self Care | Payer: Commercial Managed Care - PPO | Source: Ambulatory Visit | Attending: Nurse Practitioner | Admitting: Nurse Practitioner

## 2020-11-16 DIAGNOSIS — Z1231 Encounter for screening mammogram for malignant neoplasm of breast: Secondary | ICD-10-CM | POA: Diagnosis present

## 2020-11-21 ENCOUNTER — Other Ambulatory Visit (HOSPITAL_COMMUNITY): Payer: Self-pay | Admitting: Nurse Practitioner

## 2020-11-21 DIAGNOSIS — R928 Other abnormal and inconclusive findings on diagnostic imaging of breast: Secondary | ICD-10-CM

## 2020-11-22 ENCOUNTER — Ambulatory Visit (HOSPITAL_COMMUNITY)
Admission: RE | Admit: 2020-11-22 | Discharge: 2020-11-22 | Disposition: A | Discharge status: Home / Self Care | Payer: Commercial Managed Care - PPO | Source: Ambulatory Visit | Attending: Nurse Practitioner | Admitting: Nurse Practitioner

## 2020-11-22 ENCOUNTER — Other Ambulatory Visit: Payer: Self-pay

## 2020-11-22 DIAGNOSIS — R928 Other abnormal and inconclusive findings on diagnostic imaging of breast: Secondary | ICD-10-CM | POA: Diagnosis present

## 2021-06-19 ENCOUNTER — Encounter: Payer: Self-pay | Admitting: *Deleted

## 2021-12-19 ENCOUNTER — Other Ambulatory Visit (HOSPITAL_COMMUNITY): Payer: Self-pay | Admitting: Internal Medicine

## 2021-12-19 DIAGNOSIS — Z1231 Encounter for screening mammogram for malignant neoplasm of breast: Secondary | ICD-10-CM

## 2022-01-03 ENCOUNTER — Other Ambulatory Visit: Payer: Self-pay

## 2022-01-03 ENCOUNTER — Ambulatory Visit (HOSPITAL_COMMUNITY)
Admission: RE | Admit: 2022-01-03 | Discharge: 2022-01-03 | Disposition: A | Discharge status: Home / Self Care | Payer: Commercial Managed Care - PPO | Source: Ambulatory Visit | Attending: Internal Medicine | Admitting: Internal Medicine

## 2022-01-03 DIAGNOSIS — Z1231 Encounter for screening mammogram for malignant neoplasm of breast: Secondary | ICD-10-CM | POA: Insufficient documentation

## 2023-01-10 ENCOUNTER — Other Ambulatory Visit (HOSPITAL_COMMUNITY): Payer: Self-pay | Admitting: Internal Medicine

## 2023-01-10 DIAGNOSIS — Z1231 Encounter for screening mammogram for malignant neoplasm of breast: Secondary | ICD-10-CM

## 2023-01-16 ENCOUNTER — Ambulatory Visit (HOSPITAL_COMMUNITY)
Admission: RE | Admit: 2023-01-16 | Discharge: 2023-01-16 | Disposition: A | Discharge status: Home / Self Care | Payer: Commercial Managed Care - PPO | Source: Ambulatory Visit | Attending: Internal Medicine | Admitting: Internal Medicine

## 2023-01-16 ENCOUNTER — Encounter (HOSPITAL_COMMUNITY): Payer: Self-pay

## 2023-01-16 DIAGNOSIS — Z1231 Encounter for screening mammogram for malignant neoplasm of breast: Secondary | ICD-10-CM | POA: Diagnosis not present

## 2023-04-29 ENCOUNTER — Other Ambulatory Visit (HOSPITAL_COMMUNITY): Payer: Self-pay | Admitting: Internal Medicine

## 2023-04-29 ENCOUNTER — Ambulatory Visit (HOSPITAL_COMMUNITY)
Admission: RE | Admit: 2023-04-29 | Discharge: 2023-04-29 | Disposition: A | Discharge status: Home / Self Care | Payer: Medicare HMO | Source: Ambulatory Visit | Attending: Internal Medicine | Admitting: Internal Medicine

## 2023-04-29 DIAGNOSIS — R059 Cough, unspecified: Secondary | ICD-10-CM

## 2023-08-22 ENCOUNTER — Encounter (INDEPENDENT_AMBULATORY_CARE_PROVIDER_SITE_OTHER): Payer: Self-pay | Admitting: *Deleted

## 2023-09-19 IMAGING — MG MM DIGITAL SCREENING BILAT W/ TOMO AND CAD
6 of 12 series · 6 of 36 positions shown · non-contrast
Comparison: Previous exam(s).

CLINICAL DATA: Screening.

EXAM:
DIGITAL SCREENING BILATERAL MAMMOGRAM WITH TOMOSYNTHESIS AND CAD
TECHNIQUE: Bilateral screening digital craniocaudal and mediolateral oblique
mammograms were obtained. Bilateral screening digital breast
tomosynthesis was performed. The images were evaluated with
computer-aided detection.

[R MLO synth-2D (1 of 2)]
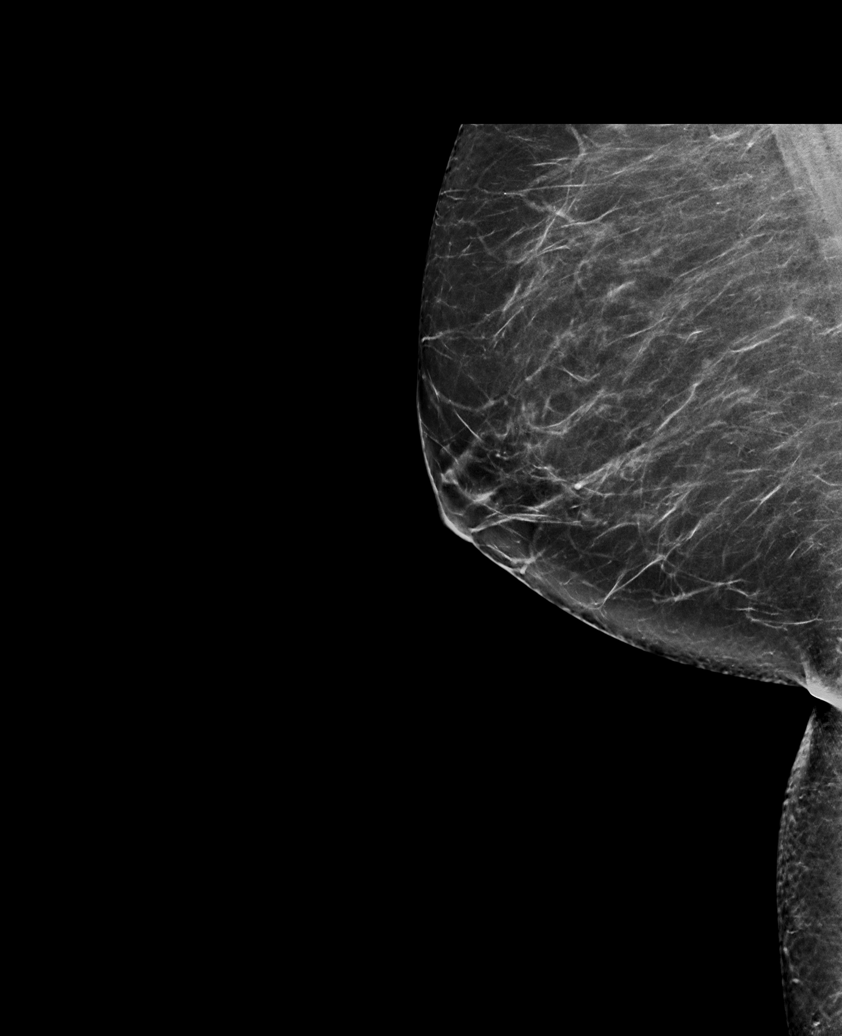

[L MLO synth-2D (1 of 2)]
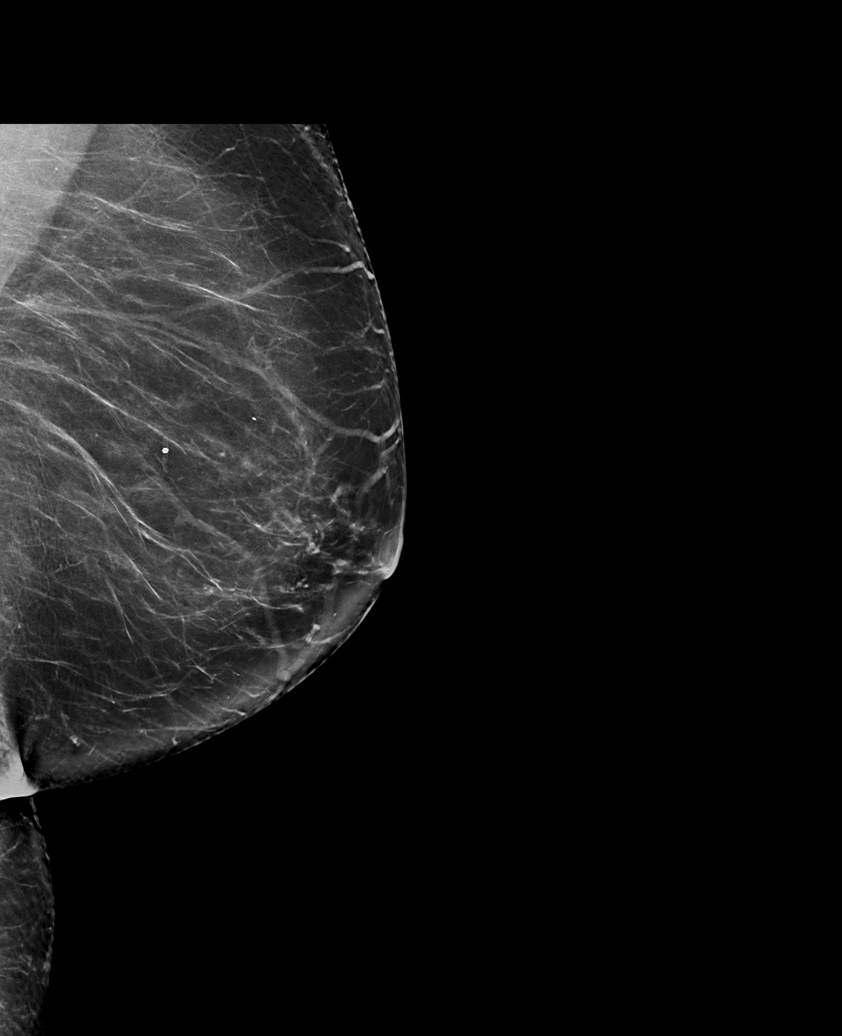

[R CC synth-2D]
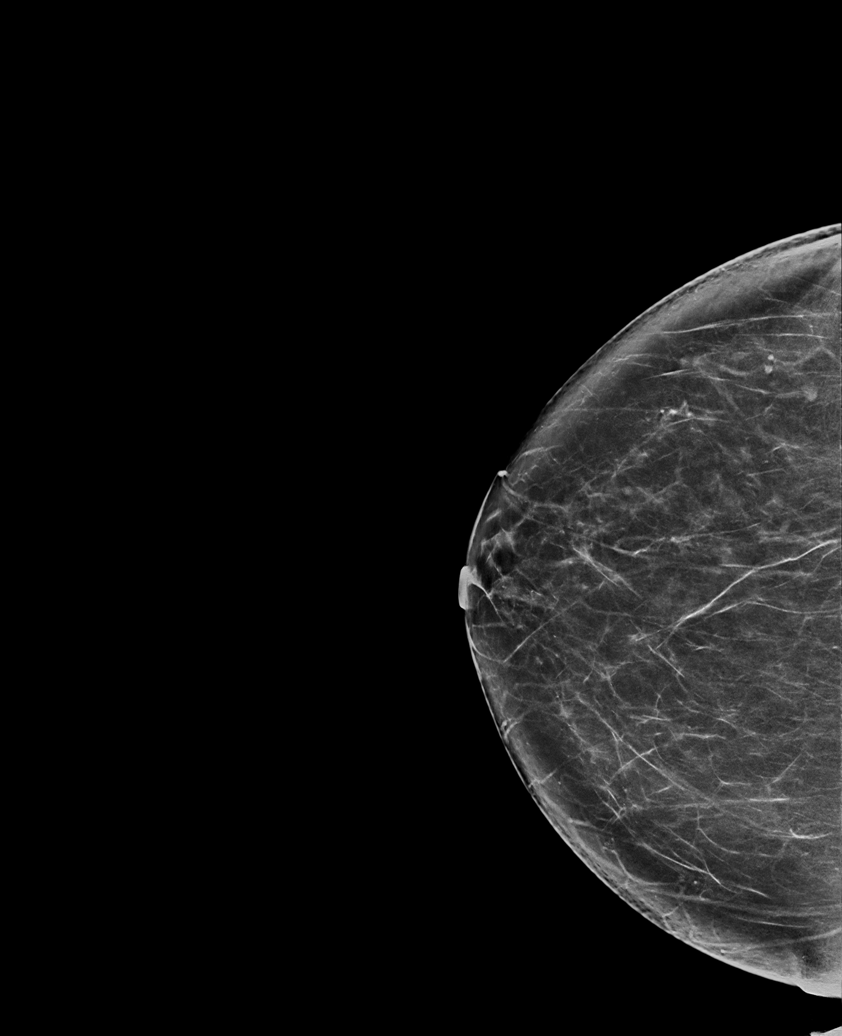

[L CC synth-2D]
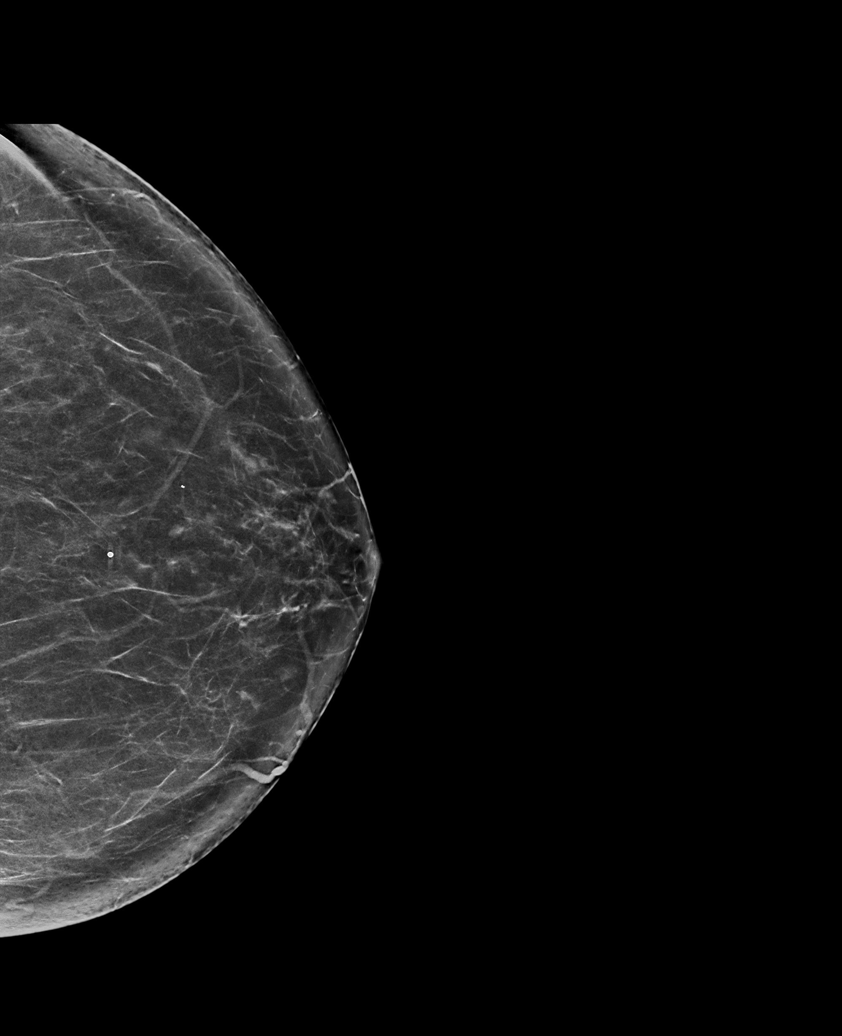

[R MLO synth-2D (2 of 2)]
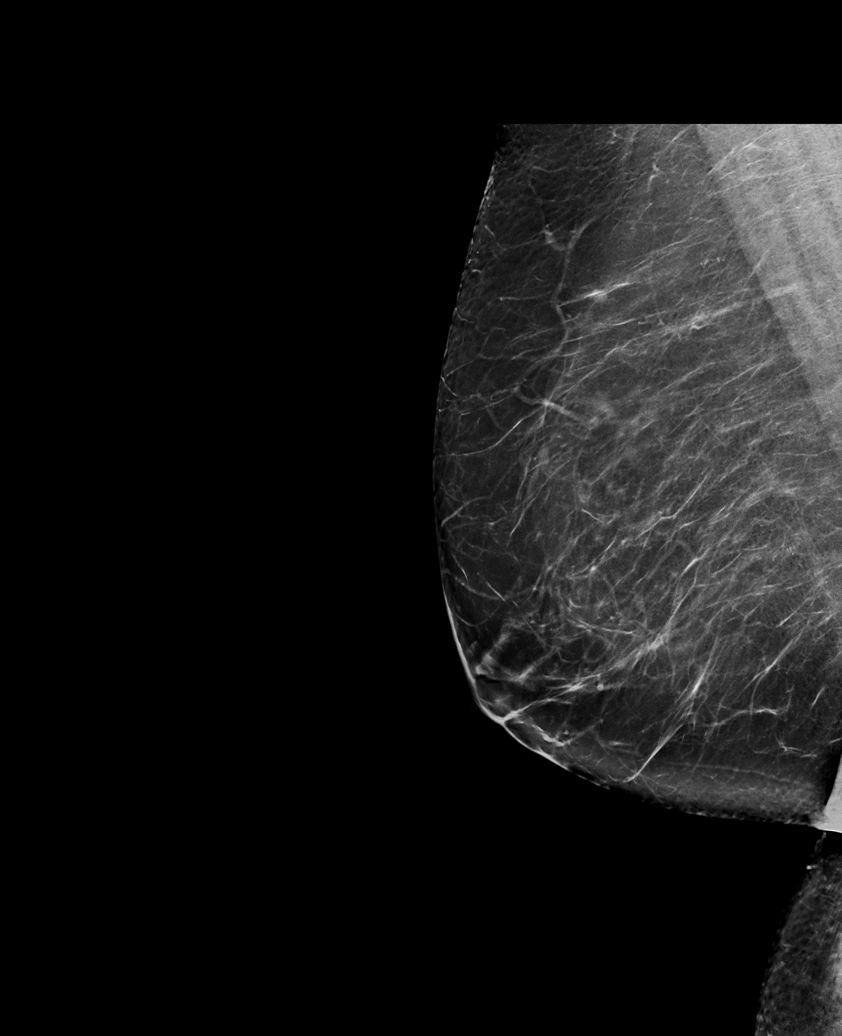

[L MLO synth-2D (2 of 2)]
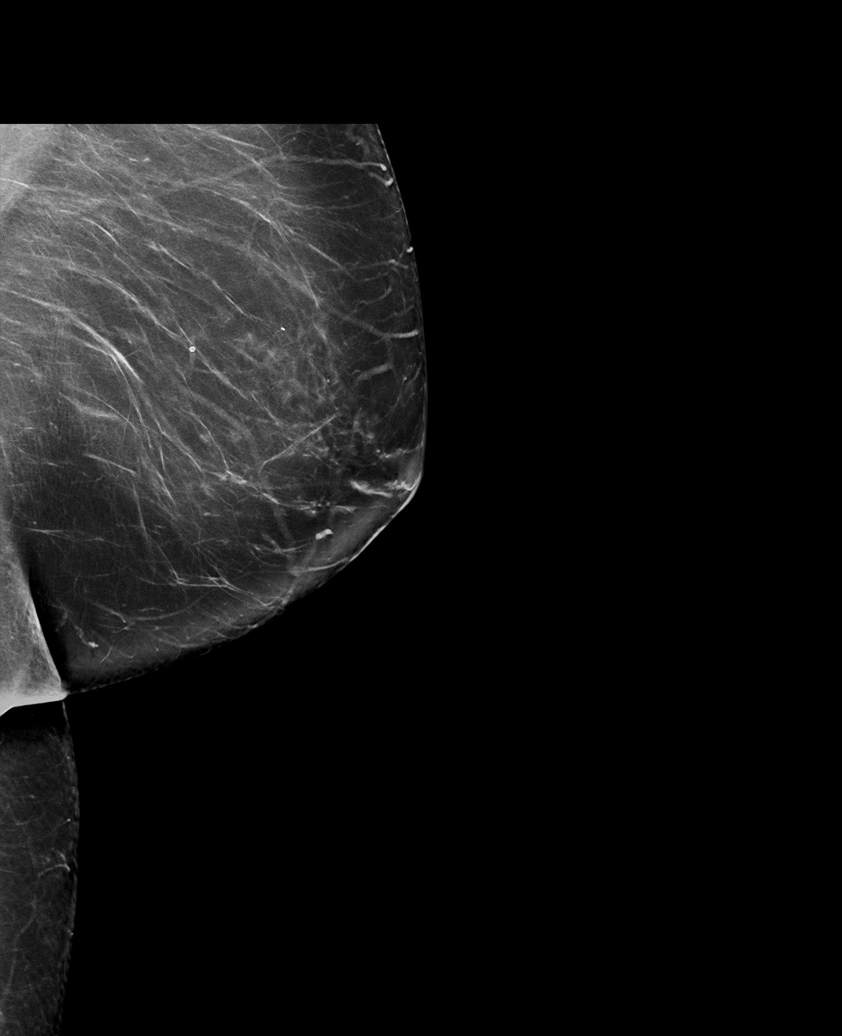

[6 of 36 positions shown; findings below may reference images not displayed]

ACR Breast Density Category b: There are scattered areas of
fibroglandular density.
FINDINGS: There are no findings suspicious for malignancy.
IMPRESSION: No mammographic evidence of malignancy. A result letter of this
screening mammogram will be mailed directly to the patient.

RECOMMENDATION:
Screening mammogram in one year. (Code:51-O-LD2)

BI-RADS CATEGORY  1: Negative.

## 2023-11-28 ENCOUNTER — Ambulatory Visit (INDEPENDENT_AMBULATORY_CARE_PROVIDER_SITE_OTHER): Payer: Medicare HMO | Admitting: Gastroenterology

## 2023-11-28 ENCOUNTER — Encounter (INDEPENDENT_AMBULATORY_CARE_PROVIDER_SITE_OTHER): Payer: Self-pay | Admitting: Gastroenterology

## 2023-11-28 VITALS — BP 119/77 | HR 85 | Temp 97.5°F | Ht 66.0 in | Wt 192.0 lb

## 2023-11-28 DIAGNOSIS — K219 Gastro-esophageal reflux disease without esophagitis: Secondary | ICD-10-CM

## 2023-11-28 DIAGNOSIS — K5909 Other constipation: Secondary | ICD-10-CM

## 2023-11-28 DIAGNOSIS — K581 Irritable bowel syndrome with constipation: Secondary | ICD-10-CM

## 2023-11-28 NOTE — Patient Instructions (Signed)
 Schedule EGD and colonoscopy Can take Pepcid 20 mg OTC as needed if presenting episodes of heartburn or reflux Explained presumed etiology of reflux symptoms. Discussed avoidance of eating within 2 hours of lying down to sleep and benefit of blocks to elevate head of bed. Also, will benefit from avoiding carbonated drinks/sodas or food that has tomatoes, spicy or greasy food. Eat 2 prunes and/or kiwi daily If presenting recurrent constipation episodes, can also take MiraLAX or stool softeners on a daily basis.

## 2023-11-28 NOTE — Progress Notes (Signed)
 Katrinka Blazing, M.D. Gastroenterology & Hepatology [facility] [address] [place], [place] [address] Primary Care Physician: Carylon Perches, MD [address] [place] [address]  Referring MD: Carylon Perches, MD  Chief Complaint:  constipation and GERD.  History of Present Illness: Mariah Mack is a 68 y.o. female with past medical history of IBS-M, who presents for evaluation of constipation and GERD.  Patient reports that close to 5 years ago she has presented some intermittent episodes of acid regurgitation in her chest intermittently with some episodes of heartburn, especially at night. She takes Tums for this possibly once a month, but milder episodes occur once a week and resolve on their own. States these episodes happen when she eats spicy food or citrus foods. No dysphagia or odynophagia. Has seen an ENT in the past and reports the laryngoscopy was normal.  Also reports a long standing constipation intermittently. She takes stool softeners and Miralax when she has significant constipation. She takes oranges and plenty of water, also tries to eat more vegetables.  The patient denies having any nausea, vomiting, fever, chills, hematochezia, melena, hematemesis, abdominal distention, abdominal pain, diarrhea, jaundice, pruritus or weight loss.  Last WGN:FAOZH Last Colonoscopy: 2012, performed by Dr. Jena Gauss Normal colonoscopy, recommend a repeat in 10 years.  FHx: neg for any gastrointestinal/liver disease, no malignancies Social: neg smoking, alcohol or illicit drug use Surgical: appendectomy  Past Medical History: Past Medical History:  Diagnosis Date   IBS (irritable bowel syndrome)     Past Surgical History: Past Surgical History:  Procedure Laterality Date   APPENDECTOMY     COLONOSCOPY  07/17/2011   Procedure: COLONOSCOPY;  Surgeon: Corbin Ade, MD;  [facility];  Service: Endoscopy;  Laterality:  N/A;  7:30AM   TUBAL LIGATION      Family History: Family History  Problem Relation Age of Onset   Stroke Father    Diabetes Mellitus II Father    Hypertension Father    Coronary artery disease Mother    Anesthesia problems Neg Hx    Hypotension Neg Hx    Malignant hyperthermia Neg Hx    Pseudochol deficiency Neg Hx     Social History: Social History   Tobacco Use  Smoking Status Former   Current packs/day: 0.00   Average packs/day: 0.5 packs/day for 8.0 years (4.0 ttl pk-yrs)   Types: Cigarettes   Start date: 07/16/1977   Quit date: 07/16/1985   Years since quitting: 38.3  Smokeless Tobacco Never   Social History   Substance and Sexual Activity  Alcohol Use Yes   Comment: rare    Social History   Substance and Sexual Activity  Drug Use No    Allergies: No Known Allergies  Medications: Current Outpatient Medications  Medication Sig Dispense Refill   clobetasol (TEMOVATE) 0.05 % external solution Apply 1 Application topically 2 (two) times daily. As needed.     No current facility-administered medications for this visit.    Review of Systems: GENERAL: negative for malaise, night sweats HEENT: No changes in hearing or vision, no nose bleeds or other nasal problems. NECK: Negative for lumps, goiter, pain and significant neck swelling RESPIRATORY: Negative for cough, wheezing CARDIOVASCULAR: Negative for chest pain, leg swelling, palpitations, orthopnea GI: SEE HPI MUSCULOSKELETAL: Negative for joint pain or swelling, back pain, and muscle pain. SKIN: Negative for lesions, rash PSYCH: Negative for sleep disturbance, mood disorder and recent psychosocial stressors. HEMATOLOGY Negative for prolonged bleeding, bruising easily,  and swollen nodes. ENDOCRINE: Negative for cold or heat intolerance, polyuria, polydipsia and goiter. NEURO: negative for tremor, gait imbalance, syncope and seizures. The remainder of the review of systems is  noncontributory.   Physical Exam: BP 119/77 (BP Location: Right Arm, Patient Position: Sitting, Cuff Size: Large)   Pulse 85   Temp (!) 97.5 F (36.4 C) (Temporal)   Ht 5\' 6"  (1.676 m)   Wt 192 lb (87.1 kg)   BMI 30.99 kg/m  GENERAL: The patient is AO x3, in no acute distress. HEENT: Head is normocephalic and atraumatic. EOMI are intact. Mouth is well hydrated and without lesions. NECK: Supple. No masses LUNGS: Clear to auscultation. No presence of rhonchi/wheezing/rales. Adequate chest expansion HEART: RRR, normal s1 and s2. ABDOMEN: Soft, nontender, no guarding, no peritoneal signs, and nondistended. BS +. No masses. EXTREMITIES: Without any cyanosis, clubbing, rash, lesions or edema. NEUROLOGIC: AOx3, no focal motor deficit. SKIN: no jaundice, no rashes   Imaging/Labs: as above  I personally reviewed and interpreted the available labs, imaging and endoscopic files.  Impression and Plan: Mariah Mack is a 68 y.o. female with past medical history of IBS-M, who presents for evaluation of constipation and GERD.  Patient has presented intermittent episodes of classic reflux symptoms that are happening at least once a week.  She can try using Pepcid as needed for symptom control, but if symptoms persist may need to try using a PPI on a regular basis.  Will evaluate her symptoms further with an EGD.  She is also due for colorectal cancer screening, for which she will be scheduled for a colonoscopy in the same day.  Patient has presented issues with constipation chronically that has been managed with laxatives.  She usually increase the intake of fiber but can take MiraLAX or stool softeners as needed.  -Schedule EGD and colonoscopy -Can take Pepcid 20 mg OTC as needed if presenting episodes of heartburn or reflux -Explained presumed etiology of reflux symptoms. Discussed avoidance of eating within 2 hours of lying down to sleep and benefit of blocks to elevate head of bed. Also,  will benefit from avoiding carbonated drinks/sodas or food that has tomatoes, spicy or greasy food. -Eat 2 prunes and/or kiwi daily -If presenting recurrent constipation episodes, can also take MiraLAX or stool softeners on a daily basis.  All questions were answered.      Katrinka Blazing, MD Gastroenterology and Hepatology [facility]

## 2023-11-28 NOTE — H&P (View-Only) (Signed)
Katrinka Blazing, M.D. Gastroenterology & Hepatology [facility] [address] [place], [place] [address] Primary Care Physician: Carylon Perches, MD [address] [place] [address]  Referring MD: Carylon Perches, MD  Chief Complaint:  constipation and GERD.  History of Present Illness: Mariah Mack is a 68 y.o. female with past medical history of IBS-M, who presents for evaluation of constipation and GERD.  Patient reports that close to 5 years ago she has presented some intermittent episodes of acid regurgitation in her chest intermittently with some episodes of heartburn, especially at night. She takes Tums for this possibly once a month, but milder episodes occur once a week and resolve on their own. States these episodes happen when she eats spicy food or citrus foods. No dysphagia or odynophagia. Has seen an ENT in the past and reports the laryngoscopy was normal.  Also reports a long standing constipation intermittently. She takes stool softeners and Miralax when she has significant constipation. She takes oranges and plenty of water, also tries to eat more vegetables.  The patient denies having any nausea, vomiting, fever, chills, hematochezia, melena, hematemesis, abdominal distention, abdominal pain, diarrhea, jaundice, pruritus or weight loss.  Last WGN:FAOZH Last Colonoscopy: 2012, performed by Dr. Jena Gauss Normal colonoscopy, recommend a repeat in 10 years.  FHx: neg for any gastrointestinal/liver disease, no malignancies Social: neg smoking, alcohol or illicit drug use Surgical: appendectomy  Past Medical History: Past Medical History:  Diagnosis Date   IBS (irritable bowel syndrome)     Past Surgical History: Past Surgical History:  Procedure Laterality Date   APPENDECTOMY     COLONOSCOPY  07/17/2011   Procedure: COLONOSCOPY;  Surgeon: Corbin Ade, MD;  Location: [facility];  Service: Endoscopy;  Laterality:  N/A;  7:30AM   TUBAL LIGATION      Family History: Family History  Problem Relation Age of Onset   Stroke Father    Diabetes Mellitus II Father    Hypertension Father    Coronary artery disease Mother    Anesthesia problems Neg Hx    Hypotension Neg Hx    Malignant hyperthermia Neg Hx    Pseudochol deficiency Neg Hx     Social History: Social History   Tobacco Use  Smoking Status Former   Current packs/day: 0.00   Average packs/day: 0.5 packs/day for 8.0 years (4.0 ttl pk-yrs)   Types: Cigarettes   Start date: 07/16/1977   Quit date: 07/16/1985   Years since quitting: 38.3  Smokeless Tobacco Never   Social History   Substance and Sexual Activity  Alcohol Use Yes   Comment: rare    Social History   Substance and Sexual Activity  Drug Use No    Allergies: No Known Allergies  Medications: Current Outpatient Medications  Medication Sig Dispense Refill   clobetasol (TEMOVATE) 0.05 % external solution Apply 1 Application topically 2 (two) times daily. As needed.     No current facility-administered medications for this visit.    Review of Systems: GENERAL: negative for malaise, night sweats HEENT: No changes in hearing or vision, no nose bleeds or other nasal problems. NECK: Negative for lumps, goiter, pain and significant neck swelling RESPIRATORY: Negative for cough, wheezing CARDIOVASCULAR: Negative for chest pain, leg swelling, palpitations, orthopnea GI: SEE HPI MUSCULOSKELETAL: Negative for joint pain or swelling, back pain, and muscle pain. SKIN: Negative for lesions, rash PSYCH: Negative for sleep disturbance, mood disorder and recent psychosocial stressors. HEMATOLOGY Negative for prolonged bleeding, bruising easily,  and swollen nodes. ENDOCRINE: Negative for cold or heat intolerance, polyuria, polydipsia and goiter. NEURO: negative for tremor, gait imbalance, syncope and seizures. The remainder of the review of systems is  noncontributory.   Physical Exam: BP 119/77 (BP Location: Right Arm, Patient Position: Sitting, Cuff Size: Large)   Pulse 85   Temp (!) 97.5 F (36.4 C) (Temporal)   Ht 5\' 6"  (1.676 m)   Wt 192 lb (87.1 kg)   BMI 30.99 kg/m  GENERAL: The patient is AO x3, in no acute distress. HEENT: Head is normocephalic and atraumatic. EOMI are intact. Mouth is well hydrated and without lesions. NECK: Supple. No masses LUNGS: Clear to auscultation. No presence of rhonchi/wheezing/rales. Adequate chest expansion HEART: RRR, normal s1 and s2. ABDOMEN: Soft, nontender, no guarding, no peritoneal signs, and nondistended. BS +. No masses. EXTREMITIES: Without any cyanosis, clubbing, rash, lesions or edema. NEUROLOGIC: AOx3, no focal motor deficit. SKIN: no jaundice, no rashes   Imaging/Labs: as above  I personally reviewed and interpreted the available labs, imaging and endoscopic files.  Impression and Plan: Mariah Mack is a 68 y.o. female with past medical history of IBS-M, who presents for evaluation of constipation and GERD.  Patient has presented intermittent episodes of classic reflux symptoms that are happening at least once a week.  She can try using Pepcid as needed for symptom control, but if symptoms persist may need to try using a PPI on a regular basis.  Will evaluate her symptoms further with an EGD.  She is also due for colorectal cancer screening, for which she will be scheduled for a colonoscopy in the same day.  Patient has presented issues with constipation chronically that has been managed with laxatives.  She usually increase the intake of fiber but can take MiraLAX or stool softeners as needed.  -Schedule EGD and colonoscopy -Can take Pepcid 20 mg OTC as needed if presenting episodes of heartburn or reflux -Explained presumed etiology of reflux symptoms. Discussed avoidance of eating within 2 hours of lying down to sleep and benefit of blocks to elevate head of bed. Also,  will benefit from avoiding carbonated drinks/sodas or food that has tomatoes, spicy or greasy food. -Eat 2 prunes and/or kiwi daily -If presenting recurrent constipation episodes, can also take MiraLAX or stool softeners on a daily basis.  All questions were answered.      Katrinka Blazing, MD Gastroenterology and Hepatology [facility]

## 2023-12-13 ENCOUNTER — Other Ambulatory Visit: Payer: Self-pay

## 2023-12-13 ENCOUNTER — Ambulatory Visit (HOSPITAL_COMMUNITY): Payer: Medicare HMO | Admitting: Anesthesiology

## 2023-12-13 ENCOUNTER — Ambulatory Visit (HOSPITAL_COMMUNITY)
Admission: RE | Admit: 2023-12-13 | Discharge: 2023-12-13 | Disposition: A | Discharge status: Home / Self Care | Payer: Medicare HMO | Attending: Gastroenterology | Admitting: Gastroenterology

## 2023-12-13 ENCOUNTER — Encounter (HOSPITAL_COMMUNITY)
Admission: RE | Disposition: A | Discharge status: Home / Self Care | Payer: Self-pay | Source: Home / Self Care | Attending: Gastroenterology

## 2023-12-13 ENCOUNTER — Encounter (HOSPITAL_COMMUNITY): Payer: Self-pay | Admitting: Gastroenterology

## 2023-12-13 DIAGNOSIS — K2289 Other specified disease of esophagus: Secondary | ICD-10-CM | POA: Diagnosis not present

## 2023-12-13 DIAGNOSIS — K449 Diaphragmatic hernia without obstruction or gangrene: Secondary | ICD-10-CM | POA: Diagnosis not present

## 2023-12-13 DIAGNOSIS — K2270 Barrett's esophagus without dysplasia: Secondary | ICD-10-CM | POA: Diagnosis not present

## 2023-12-13 DIAGNOSIS — K649 Unspecified hemorrhoids: Secondary | ICD-10-CM | POA: Diagnosis not present

## 2023-12-13 DIAGNOSIS — K2950 Unspecified chronic gastritis without bleeding: Secondary | ICD-10-CM | POA: Diagnosis not present

## 2023-12-13 DIAGNOSIS — D120 Benign neoplasm of cecum: Secondary | ICD-10-CM | POA: Diagnosis not present

## 2023-12-13 DIAGNOSIS — Z1211 Encounter for screening for malignant neoplasm of colon: Secondary | ICD-10-CM

## 2023-12-13 DIAGNOSIS — Z87891 Personal history of nicotine dependence: Secondary | ICD-10-CM | POA: Insufficient documentation

## 2023-12-13 DIAGNOSIS — K2100 Gastro-esophageal reflux disease with esophagitis, without bleeding: Secondary | ICD-10-CM | POA: Insufficient documentation

## 2023-12-13 DIAGNOSIS — K581 Irritable bowel syndrome with constipation: Secondary | ICD-10-CM | POA: Diagnosis not present

## 2023-12-13 DIAGNOSIS — K644 Residual hemorrhoidal skin tags: Secondary | ICD-10-CM | POA: Insufficient documentation

## 2023-12-13 DIAGNOSIS — D123 Benign neoplasm of transverse colon: Secondary | ICD-10-CM

## 2023-12-13 DIAGNOSIS — K31A0 Gastric intestinal metaplasia, unspecified: Secondary | ICD-10-CM

## 2023-12-13 HISTORY — PX: POLYPECTOMY: SHX149

## 2023-12-13 HISTORY — PX: BIOPSY: SHX5522

## 2023-12-13 HISTORY — PX: ESOPHAGOGASTRODUODENOSCOPY (EGD) WITH PROPOFOL: SHX5813

## 2023-12-13 HISTORY — PX: COLONOSCOPY WITH PROPOFOL: SHX5780

## 2023-12-13 LAB — HM COLONOSCOPY

## 2023-12-13 SURGERY — ESOPHAGOGASTRODUODENOSCOPY (EGD) WITH PROPOFOL
Anesthesia: General

## 2023-12-13 MED ORDER — LIDOCAINE HCL (CARDIAC) PF 100 MG/5ML IV SOSY
PREFILLED_SYRINGE | INTRAVENOUS | Status: DC | PRN
  Administered 2023-12-13: 80 mg via INTRAVENOUS

## 2023-12-13 MED ORDER — PROPOFOL 10 MG/ML IV BOLUS
INTRAVENOUS | Status: DC | PRN
  Administered 2023-12-13: 100 mg via INTRAVENOUS

## 2023-12-13 MED ORDER — LACTATED RINGERS IV SOLN: INTRAVENOUS | Status: DC | PRN

## 2023-12-13 MED ORDER — PROPOFOL 500 MG/50ML IV EMUL
INTRAVENOUS | Status: DC | PRN
  Administered 2023-12-13: 200 ug/kg/min via INTRAVENOUS

## 2023-12-13 MED ORDER — STERILE WATER FOR IRRIGATION IR SOLN
Status: DC | PRN
  Administered 2023-12-13: 120 mL
  Administered 2023-12-13: 60 mL

## 2023-12-13 NOTE — Anesthesia Postprocedure Evaluation (Signed)
Anesthesia Post Note  Patient: Faige Denbow  Procedure(s) Performed: ESOPHAGOGASTRODUODENOSCOPY (EGD) WITH PROPOFOL COLONOSCOPY WITH PROPOFOL BIOPSY POLYPECTOMY INTESTINAL  Patient location during evaluation: PACU Anesthesia Type: General Level of consciousness: awake and alert Pain management: pain level controlled Vital Signs Assessment: post-procedure vital signs reviewed and stable Respiratory status: spontaneous breathing, nonlabored ventilation, respiratory function stable and patient connected to nasal cannula oxygen Cardiovascular status: blood pressure returned to baseline and stable Postop Assessment: no apparent nausea or vomiting Anesthetic complications: no   There were no known notable events for this encounter.   Last Vitals:  Vitals:   12/13/23 1304 12/13/23 1306  BP: (!) 93/52 (!) 101/50  Pulse:    Resp:    Temp:    SpO2:      Last Pain:  Vitals:   12/13/23 1302  TempSrc:   PainSc: 0-No pain                 Tenelle Andreason L Reshunda Strider

## 2023-12-13 NOTE — Discharge Instructions (Addendum)
You are being discharged to home.  Resume your previous diet.  We are waiting for your pathology results.  Continue your present medications.  We are waiting for your pathology results.  Your physician has recommended a repeat colonoscopy for surveillance based on pathology results.

## 2023-12-13 NOTE — Op Note (Signed)
[facility] Patient Name: Mariah Mack Procedure Date: 12/13/2023 11:22 AM MRN: [id number] Date of Birth: 1956/03/27 Attending MD: Katrinka Blazing , , [id number] CSN: [id number] Age: 68 Admit Type: Outpatient Procedure:                Upper GI endoscopy Indications:              Gastro-esophageal reflux disease Providers:                Katrinka Blazing, Crystal Page, Pandora Leiter,                            Technician Referring MD:              Medicines:                Monitored Anesthesia Care Complications:            No immediate complications. Estimated Blood Loss:     Estimated blood loss: none. Procedure:                Pre-Anesthesia Assessment:                           - Prior to the procedure, a History and Physical                            was performed, and patient medications, allergies                            and sensitivities were reviewed. The patient's                            tolerance of previous anesthesia was reviewed.                           - The risks and benefits of the procedure and the                            sedation options and risks were discussed with the                            patient. All questions were answered and informed                            consent was obtained.                           - ASA Grade Assessment: I - A normal, healthy                            patient.                           After obtaining informed consent, the endoscope was                            passed under direct vision. Throughout the  procedure, the patient's blood pressure, pulse, and                            oxygen saturations were monitored continuously. The                            [id number]-[id number] ([id number]) scope was introduced through the                            mouth, and advanced to the second part of duodenum.                            The upper GI endoscopy was accomplished without                             difficulty. The patient tolerated the procedure                            well. Scope In: 12:21:57 PM Scope Out: 12:27:56 PM Total Procedure Duration: 0 hours 5 minutes 59 seconds  Findings:      The Z-line was irregular and was found 37 cm from the incisors. Biopsies       were taken with a cold forceps for histology.      A 3 cm hiatal hernia was present.      The gastroesophageal flap valve was visualized endoscopically and       classified as Hill Grade III (minimal fold, loose to endoscope, hiatal       hernia likely).      The stomach was normal.      The examined duodenum was normal. Impression:               - Z-line irregular, 37 cm from the incisors.                            Biopsied.                           - 3 cm hiatal hernia.                           - Normal stomach.                           - Normal examined duodenum. Moderate Sedation:      Per Anesthesia Care Recommendation:           - Discharge patient to home (ambulatory).                           - Resume previous diet.                           - Await pathology results.                           - Continue present medications. Procedure Code(s):        --- Professional ---  13086, Esophagogastroduodenoscopy, flexible,                            transoral; with biopsy, single or multiple Diagnosis Code(s):        --- Professional ---                           K22.89, Other specified disease of esophagus                           K44.9, Diaphragmatic hernia without obstruction or                            gangrene                           K21.9, Gastro-esophageal reflux disease without                            esophagitis CPT copyright 2022 American Medical Association. All rights reserved. The codes documented in this report are preliminary and upon coder review may  be revised to meet current compliance requirements. Katrinka Blazing, MD Katrinka Blazing,   12/13/2023 12:30:55 PM This report has been signed electronically. Number of Addenda: 0

## 2023-12-13 NOTE — Transfer of Care (Signed)
Immediate Anesthesia Transfer of Care Note  Patient: Helmi Lascala  Procedure(s) Performed: ESOPHAGOGASTRODUODENOSCOPY (EGD) WITH PROPOFOL COLONOSCOPY WITH PROPOFOL BIOPSY POLYPECTOMY INTESTINAL  Patient Location: Short Stay  Anesthesia Type:General  Level of Consciousness: awake, alert , oriented, and patient cooperative  Airway & Oxygen Therapy: Patient Spontanous Breathing  Post-op Assessment: Report given to RN, Post -op Vital signs reviewed and stable, and Patient moving all extremities X 4  Post vital signs: Reviewed and stable  Last Vitals:  Vitals Value Taken Time  BP 82/55 12/13/23 1256  Temp 36.7 C 12/13/23 1256  Pulse 64 12/13/23 1256  Resp 15 12/13/23 1256  SpO2 91 % 12/13/23 1256    Last Pain:  Vitals:   12/13/23 1256  TempSrc: Axillary  PainSc:       Patients Stated Pain Goal: 5 (12/13/23 1146)  Complications: No notable events documented.

## 2023-12-13 NOTE — Op Note (Signed)
[facility] Patient Name: Mariah Mack Procedure Date: 12/13/2023 11:21 AM MRN: [id number] Date of Birth: 08-Jan-1956 Attending MD: Katrinka Blazing , , [id number] CSN: [id number] Age: 68 Admit Type: Outpatient Procedure:                Colonoscopy Indications:              Screening for colorectal malignant neoplasm Providers:                Katrinka Blazing, Crystal Page, Pandora Leiter,                            Technician Referring MD:              Medicines:                Monitored Anesthesia Care Complications:            No immediate complications. Estimated Blood Loss:     Estimated blood loss: none. Procedure:                Pre-Anesthesia Assessment:                           - Prior to the procedure, a History and Physical                            was performed, and patient medications, allergies                            and sensitivities were reviewed. The patient's                            tolerance of previous anesthesia was reviewed.                           - The risks and benefits of the procedure and the                            sedation options and risks were discussed with the                            patient. All questions were answered and informed                            consent was obtained.                           - ASA Grade Assessment: I - A normal, healthy                            patient.                           After obtaining informed consent, the colonoscope                            was passed under direct vision. Throughout the  procedure, the patient's blood pressure, pulse, and                            oxygen saturations were monitored continuously. The                            [id number]-HQ190L ([id number]) was introduced through the                            anus and advanced to the the cecum, identified by                            appendiceal orifice and ileocecal valve. The                             colonoscopy was performed without difficulty. The                            patient tolerated the procedure well. The quality                            of the bowel preparation was excellent. Scope In: 12:31:34 PM Scope Out: 12:52:15 PM Scope Withdrawal Time: 0 hours 13 minutes 19 seconds  Total Procedure Duration: 0 hours 20 minutes 41 seconds  Findings:      Hemorrhoids were found on perianal exam.      Two sessile polyps were found in the transverse colon and ileocecal       valve. The polyps were 1 to 8 mm in size. These polyps were removed with       a cold snare. Resection and retrieval were complete.      The retroflexed view of the distal rectum and anal verge was normal and       showed no anal or rectal abnormalities. Impression:               - Hemorrhoids found on perianal exam.                           - Two 1 to 8 mm polyps in the transverse colon and                            at the ileocecal valve, removed with a cold snare.                            Resected and retrieved.                           - The distal rectum and anal verge are normal on                            retroflexion view. Moderate Sedation:      Per Anesthesia Care Recommendation:           - Discharge patient to home (ambulatory).                           -  Resume previous diet.                           - Await pathology results.                           - Repeat colonoscopy for surveillance based on                            pathology results. Procedure Code(s):        --- Professional ---                           [phone], Colonoscopy, flexible; with removal of                            tumor(s), polyp(s), or other lesion(s) by snare                            technique Diagnosis Code(s):        --- Professional ---                           Z12.11, Encounter for screening for malignant                            neoplasm of colon                           D12.3, Benign neoplasm  of transverse colon (hepatic                            flexure or splenic flexure)                           D12.0, Benign neoplasm of cecum                           K64.9, Unspecified hemorrhoids CPT copyright 2022 American Medical Association. All rights reserved. The codes documented in this report are preliminary and upon coder review may  be revised to meet current compliance requirements. Katrinka Blazing, MD Katrinka Blazing,  12/13/2023 1:00:11 PM This report has been signed electronically. Number of Addenda: 0

## 2023-12-13 NOTE — Interval H&P Note (Signed)
History and Physical Interval Note:  12/13/2023 12:08 PM  Mariah Mack  has presented today for surgery, with the diagnosis of gerd,screening.  The various methods of treatment have been discussed with the patient and family. After consideration of risks, benefits and other options for treatment, the patient has consented to  Procedure(s) with comments: ESOPHAGOGASTRODUODENOSCOPY (EGD) WITH PROPOFOL (N/A) - 1:00pm;asa 1 COLONOSCOPY WITH PROPOFOL (N/A) - 1:00pm;asa 1 as a surgical intervention.  The patient's history has been reviewed, patient examined, no change in status, stable for surgery.  I have reviewed the patient's chart and labs.  Questions were answered to the patient's satisfaction.     Katrinka Blazing Mayorga

## 2023-12-13 NOTE — Anesthesia Preprocedure Evaluation (Signed)
Anesthesia Evaluation  Patient identified by MRN, date of birth, ID band Patient awake    Reviewed: Allergy & Precautions, H&P , NPO status , Patient's Chart, lab work & pertinent test results, reviewed documented beta blocker date and time   History of Anesthesia Complications (+) PONV and history of anesthetic complications  Airway Mallampati: II  TM Distance: >3 FB Neck ROM: full    Dental no notable dental hx. (+) Dental Advisory Given, Teeth Intact   Pulmonary neg pulmonary ROS, former smoker   Pulmonary exam normal breath sounds clear to auscultation       Cardiovascular Exercise Tolerance: Good negative cardio ROS Normal cardiovascular exam Rhythm:regular Rate:Normal     Neuro/Psych negative neurological ROS  negative psych ROS   GI/Hepatic Neg liver ROS,GERD  ,,IBS   Endo/Other  negative endocrine ROS    Renal/GU negative Renal ROS  negative genitourinary   Musculoskeletal   Abdominal   Peds  Hematology negative hematology ROS (+)   Anesthesia Other Findings   Reproductive/Obstetrics negative OB ROS                             Anesthesia Physical Anesthesia Plan  ASA: 2  Anesthesia Plan: General   Post-op Pain Management: Minimal or no pain anticipated   Induction: Intravenous  PONV Risk Score and Plan: Propofol infusion  Airway Management Planned: Nasal Cannula and Natural Airway  Additional Equipment: None  Intra-op Plan:   Post-operative Plan:   Informed Consent: I have reviewed the patients History and Physical, chart, labs and discussed the procedure including the risks, benefits and alternatives for the proposed anesthesia with the patient or authorized representative who has indicated his/her understanding and acceptance.     Dental Advisory Given  Plan Discussed with: CRNA  Anesthesia Plan Comments:         Anesthesia Quick Evaluation

## 2023-12-16 LAB — SURGICAL PATHOLOGY

## 2023-12-17 ENCOUNTER — Other Ambulatory Visit: Payer: Self-pay | Admitting: Gastroenterology

## 2023-12-17 ENCOUNTER — Encounter (HOSPITAL_COMMUNITY): Payer: Self-pay | Admitting: Gastroenterology

## 2023-12-17 ENCOUNTER — Encounter (INDEPENDENT_AMBULATORY_CARE_PROVIDER_SITE_OTHER): Payer: Self-pay | Admitting: *Deleted

## 2023-12-17 DIAGNOSIS — K2270 Barrett's esophagus without dysplasia: Secondary | ICD-10-CM

## 2023-12-17 MED ORDER — OMEPRAZOLE 20 MG PO CPDR: 20.0000 mg | DELAYED_RELEASE_CAPSULE | Freq: Every day | ORAL | 3 refills | Status: AC

## 2024-02-06 ENCOUNTER — Other Ambulatory Visit (HOSPITAL_COMMUNITY): Payer: Self-pay | Admitting: Internal Medicine

## 2024-02-06 DIAGNOSIS — Z1231 Encounter for screening mammogram for malignant neoplasm of breast: Secondary | ICD-10-CM

## 2024-02-10 ENCOUNTER — Ambulatory Visit (HOSPITAL_COMMUNITY)
Admission: RE | Admit: 2024-02-10 | Discharge: 2024-02-10 | Disposition: A | Discharge status: Home / Self Care | Source: Ambulatory Visit | Attending: Internal Medicine | Admitting: Internal Medicine

## 2024-02-10 DIAGNOSIS — Z1231 Encounter for screening mammogram for malignant neoplasm of breast: Secondary | ICD-10-CM | POA: Insufficient documentation

## 2024-09-09 ENCOUNTER — Encounter (INDEPENDENT_AMBULATORY_CARE_PROVIDER_SITE_OTHER): Payer: Self-pay | Admitting: Gastroenterology
